# Patient Record
Sex: Female | Born: 1940 | Race: White | Hispanic: No | Marital: Married | Smoking: Never smoker
Health system: Southern US, Community
[De-identification: ages and names within clinical notes are randomized; demographics above are authoritative.]

## PROBLEM LIST (undated history)

## (undated) DIAGNOSIS — E785 Hyperlipidemia, unspecified: Secondary | ICD-10-CM

## (undated) DIAGNOSIS — I10 Essential (primary) hypertension: Secondary | ICD-10-CM

## (undated) HISTORY — DX: Hyperlipidemia, unspecified: E78.5

## (undated) HISTORY — DX: Essential (primary) hypertension: I10

---

## 1998-08-27 ENCOUNTER — Other Ambulatory Visit: Admission: RE | Admit: 1998-08-27 | Discharge: 1998-08-27 | Payer: Self-pay | Admitting: Gynecology

## 1998-09-09 ENCOUNTER — Ambulatory Visit (HOSPITAL_COMMUNITY): Admission: RE | Admit: 1998-09-09 | Discharge: 1998-09-09 | Payer: Self-pay | Admitting: Gynecology

## 2008-04-29 ENCOUNTER — Encounter: Admission: RE | Admit: 2008-04-29 | Discharge: 2008-04-29 | Payer: Self-pay | Admitting: Family Medicine

## 2008-07-08 ENCOUNTER — Emergency Department (HOSPITAL_COMMUNITY): Admission: EM | Admit: 2008-07-08 | Discharge: 2008-07-08 | Payer: Self-pay | Admitting: Emergency Medicine

## 2009-02-09 ENCOUNTER — Emergency Department (HOSPITAL_COMMUNITY): Admission: EM | Admit: 2009-02-09 | Discharge: 2009-02-09 | Payer: Self-pay | Admitting: Emergency Medicine

## 2009-02-10 ENCOUNTER — Emergency Department (HOSPITAL_COMMUNITY): Admission: EM | Admit: 2009-02-10 | Discharge: 2009-02-10 | Payer: Self-pay | Admitting: Emergency Medicine

## 2011-01-12 LAB — DIFFERENTIAL
Basophils Relative: 0 % (ref 0–1)
Eosinophils Absolute: 0 10*3/uL (ref 0.0–0.7)
Eosinophils Relative: 0 % (ref 0–5)
Lymphs Abs: 0.9 10*3/uL (ref 0.7–4.0)
Monocytes Relative: 6 % (ref 3–12)

## 2011-01-12 LAB — CBC
Hemoglobin: 14.5 g/dL (ref 12.0–15.0)
Hemoglobin: 14.9 g/dL (ref 12.0–15.0)
MCHC: 34.5 g/dL (ref 30.0–36.0)
MCHC: 34.6 g/dL (ref 30.0–36.0)
Platelets: 214 10*3/uL (ref 150–400)
RBC: 4.64 MIL/uL (ref 3.87–5.11)
RDW: 13.1 % (ref 11.5–15.5)
WBC: 7.4 10*3/uL (ref 4.0–10.5)

## 2011-01-12 LAB — SAMPLE TO BLOOD BANK

## 2011-01-12 LAB — COMPREHENSIVE METABOLIC PANEL
ALT: 20 U/L (ref 0–35)
AST: 27 U/L (ref 0–37)
Alkaline Phosphatase: 68 U/L (ref 39–117)
CO2: 27 mEq/L (ref 19–32)
Calcium: 9.4 mg/dL (ref 8.4–10.5)
GFR calc Af Amer: >60 mL/min (ref >60)
GFR calc non Af Amer: >60 mL/min (ref >60)
Glucose, Bld: 159 mg/dL — ABNORMAL HIGH (ref 70–99)
Potassium: 3.8 mEq/L (ref 3.5–5.1)
Sodium: 138 mEq/L (ref 135–145)

## 2011-01-12 LAB — PROTIME-INR: Prothrombin Time: 12.7 seconds (ref 11.6–15.2)

## 2012-08-08 DIAGNOSIS — E559 Vitamin D deficiency, unspecified: Secondary | ICD-10-CM | POA: Diagnosis not present

## 2012-08-08 DIAGNOSIS — I10 Essential (primary) hypertension: Secondary | ICD-10-CM | POA: Diagnosis not present

## 2012-08-08 DIAGNOSIS — E782 Mixed hyperlipidemia: Secondary | ICD-10-CM | POA: Diagnosis not present

## 2012-08-22 ENCOUNTER — Other Ambulatory Visit: Payer: Self-pay | Admitting: Family Medicine

## 2012-08-22 DIAGNOSIS — Z0000 Encounter for general adult medical examination without abnormal findings: Secondary | ICD-10-CM | POA: Diagnosis not present

## 2012-08-22 DIAGNOSIS — Z124 Encounter for screening for malignant neoplasm of cervix: Secondary | ICD-10-CM | POA: Diagnosis not present

## 2012-08-22 DIAGNOSIS — Z139 Encounter for screening, unspecified: Secondary | ICD-10-CM

## 2012-08-22 DIAGNOSIS — Z23 Encounter for immunization: Secondary | ICD-10-CM | POA: Diagnosis not present

## 2012-10-06 ENCOUNTER — Ambulatory Visit (HOSPITAL_COMMUNITY)
Admission: RE | Admit: 2012-10-06 | Discharge: 2012-10-06 | Disposition: A | Discharge status: Home / Self Care | Payer: Medicare Other | Source: Ambulatory Visit | Attending: Family Medicine | Admitting: Family Medicine

## 2012-10-06 DIAGNOSIS — Z139 Encounter for screening, unspecified: Secondary | ICD-10-CM

## 2012-10-06 DIAGNOSIS — Z1231 Encounter for screening mammogram for malignant neoplasm of breast: Secondary | ICD-10-CM | POA: Insufficient documentation

## 2013-08-21 ENCOUNTER — Other Ambulatory Visit: Payer: Medicare Other

## 2013-08-21 DIAGNOSIS — Z79899 Other long term (current) drug therapy: Secondary | ICD-10-CM | POA: Diagnosis not present

## 2013-08-21 DIAGNOSIS — I10 Essential (primary) hypertension: Secondary | ICD-10-CM

## 2013-08-21 DIAGNOSIS — E785 Hyperlipidemia, unspecified: Secondary | ICD-10-CM

## 2013-08-21 LAB — CBC WITH DIFFERENTIAL/PLATELET
HCT: 45.2 % (ref 36.0–46.0)
Hemoglobin: 15.5 g/dL — ABNORMAL HIGH (ref 12.0–15.0)
Lymphocytes Relative: 29 % (ref 12–46)
Lymphs Abs: 2 10*3/uL (ref 0.7–4.0)
MCV: 91.3 fL (ref 78.0–100.0)
Monocytes Absolute: 0.6 10*3/uL (ref 0.1–1.0)
Monocytes Relative: 8 % (ref 3–12)
Neutro Abs: 4.3 10*3/uL (ref 1.7–7.7)
WBC: 6.9 10*3/uL (ref 4.0–10.5)

## 2013-08-21 LAB — COMPREHENSIVE METABOLIC PANEL
AST: 25 U/L (ref 0–37)
Albumin: 4.3 g/dL (ref 3.5–5.2)
BUN: 13 mg/dL (ref 6–23)
CO2: 30 mEq/L (ref 19–32)
Calcium: 9.8 mg/dL (ref 8.4–10.5)
Chloride: 100 mEq/L (ref 96–112)
Glucose, Bld: 94 mg/dL (ref 70–99)
Potassium: 4.4 mEq/L (ref 3.5–5.3)

## 2013-08-21 LAB — LIPID PANEL
Cholesterol: 241 mg/dL — ABNORMAL HIGH (ref 0–200)
HDL: 58 mg/dL (ref >39)
Triglycerides: 83 mg/dL (ref <150)

## 2013-08-28 ENCOUNTER — Encounter: Payer: Self-pay | Admitting: Family Medicine

## 2013-08-28 ENCOUNTER — Ambulatory Visit (INDEPENDENT_AMBULATORY_CARE_PROVIDER_SITE_OTHER): Payer: Medicare Other | Admitting: Family Medicine

## 2013-08-28 VITALS — BP 126/88 | HR 92 | Temp 97.0°F | Resp 16 | Wt 178.0 lb

## 2013-08-28 DIAGNOSIS — E785 Hyperlipidemia, unspecified: Secondary | ICD-10-CM

## 2013-08-28 DIAGNOSIS — I10 Essential (primary) hypertension: Secondary | ICD-10-CM

## 2013-08-28 MED ORDER — AMLODIPINE BESY-BENAZEPRIL HCL 5-10 MG PO CAPS: 1.0000 | ORAL_CAPSULE | Freq: Every day | ORAL | Status: DC

## 2013-08-28 MED ORDER — HYDROCHLOROTHIAZIDE 12.5 MG PO CAPS: 12.5000 mg | ORAL_CAPSULE | Freq: Every day | ORAL | Status: DC

## 2013-08-28 MED ORDER — PRAVASTATIN SODIUM 20 MG PO TABS: 20.0000 mg | ORAL_TABLET | Freq: Every day | ORAL | Status: DC

## 2013-08-28 NOTE — Progress Notes (Signed)
Subjective:    Patient ID: Katherine Bass, female    DOB: [birth year], 72 y.o.   MRN: [id number]  HPI  Patient is a 72 year old white female who is here for followup today for her hypertension. She is currently on Lotrel 5/10 one by mouth daily and hydrochlorothiazide 12.5 mg by mouth daily. Her most recent labwork as listed below. She denies any chest pain shortness of breath or dyspnea on exertion. She denies any side effects of medication. Lab on 08/21/2013  Component Date Value Range Status  . WBC 08/21/2013 6.9  4.0 - 10.5 K/uL Final  . RBC 08/21/2013 4.95  3.87 - 5.11 MIL/uL Final  . Hemoglobin 08/21/2013 15.5* 12.0 - 15.0 g/dL Final  . HCT 08/65/7846 45.2  36.0 - 46.0 % Final  . MCV 08/21/2013 91.3  78.0 - 100.0 fL Final  . MCH 08/21/2013 31.3  26.0 - 34.0 pg Final  . MCHC 08/21/2013 34.3  30.0 - 36.0 g/dL Final  . RDW 96/29/5284 13.5  11.5 - 15.5 % Final  . Platelets 08/21/2013 263  150 - 400 K/uL Final  . Neutrophils Relative % 08/21/2013 61  43 - 77 % Final  . Neutro Abs 08/21/2013 4.3  1.7 - 7.7 K/uL Final  . Lymphocytes Relative 08/21/2013 29  12 - 46 % Final  . Lymphs Abs 08/21/2013 2.0  0.7 - 4.0 K/uL Final  . Monocytes Relative 08/21/2013 8  3 - 12 % Final  . Monocytes Absolute 08/21/2013 0.6  0.1 - 1.0 K/uL Final  . Eosinophils Relative 08/21/2013 1  0 - 5 % Final  . Eosinophils Absolute 08/21/2013 0.1  0.0 - 0.7 K/uL Final  . Basophils Relative 08/21/2013 1  0 - 1 % Final  . Basophils Absolute 08/21/2013 0.1  0.0 - 0.1 K/uL Final  . Smear Review 08/21/2013 Criteria for review not met   Final  . Sodium 08/21/2013 138  135 - 145 mEq/L Final  . Potassium 08/21/2013 4.4  3.5 - 5.3 mEq/L Final  . Chloride 08/21/2013 100  96 - 112 mEq/L Final  . CO2 08/21/2013 30  19 - 32 mEq/L Final  . Glucose, Bld 08/21/2013 94  70 - 99 mg/dL Final  . BUN 13/24/4010 13  6 - 23 mg/dL Final  . Creat 27/25/3664 0.66  0.50 - 1.10 mg/dL Final  . Total Bilirubin 08/21/2013 0.7  0.3 - 1.2 mg/dL  Final  . Alkaline Phosphatase 08/21/2013 65  39 - 117 U/L Final  . AST 08/21/2013 25  0 - 37 U/L Final  . ALT 08/21/2013 20  0 - 35 U/L Final  . Total Protein 08/21/2013 7.3  6.0 - 8.3 g/dL Final  . Albumin 40/34/7425 4.3  3.5 - 5.2 g/dL Final  . Calcium 95/63/8756 9.8  8.4 - 10.5 mg/dL Final  . Cholesterol 43/32/9518 241* 0 - 200 mg/dL Final   Comment: ATP III Classification:                                < 200        mg/dL        Desirable                               200 - 239     mg/dL        Borderline High                               >=  240        mg/dL        High                             . Triglycerides 08/21/2013 83  <150 mg/dL Final  . HDL 16/07/9603 58  >39 mg/dL Final  . Total CHOL/HDL Ratio 08/21/2013 4.2   Final  . VLDL 08/21/2013 17  0 - 40 mg/dL Final  . LDL Cholesterol 08/21/2013 166* 0 - 99 mg/dL Final   Comment:                            Total Cholesterol/HDL Ratio:CHD Risk                                                 Coronary Heart Disease Risk Table                                                                 Men       Women                                   1/2 Average Risk              3.4        3.3                                       Average Risk              5.0        4.4                                    2X Average Risk              9.6        7.1                                    3X Average Risk             23.4       11.0                          Use the calculated Patient Ratio above and the CHD Risk table                           to determine the patient's CHD Risk.                          ATP III Classification (LDL):                                <  100        mg/dL         Optimal                               100 - 129     mg/dL         Near or Above Optimal                               130 - 159     mg/dL         Borderline High                               160 - 189     mg/dL         High                                > 190         mg/dL         Very High                              Past Medical History  Diagnosis Date  . Hyperlipidemia   . Hypertension    No current outpatient prescriptions on file prior to visit.   No current facility-administered medications on file prior to visit.   Allergies  Allergen Reactions  . Eggs Or Egg-Derived Products Nausea Only    HA   History   Social History  . Marital Status: Married    Spouse Name: N/A    Number of Children: N/A  . Years of Education: N/A   Occupational History  . Not on file.   Social History Main Topics  . Smoking status: Never Smoker   . Smokeless tobacco: Not on file  . Alcohol Use: No  . Drug Use: No  . Sexual Activity: Not on file   Other Topics Concern  . Not on file   Social History Narrative  . No narrative on file     Review of Systems  All other systems reviewed and are negative.       Objective:   Physical Exam  Vitals reviewed. Constitutional: She is oriented to person, place, and time.  Neck: Neck supple. No JVD present. No thyromegaly present.  Cardiovascular: Normal rate, regular rhythm and normal heart sounds.  Exam reveals no gallop and no friction rub.   No murmur heard. Pulmonary/Chest: Effort normal and breath sounds normal. No respiratory distress. She has no wheezes. She has no rales. She exhibits no tenderness.  Abdominal: Soft. Bowel sounds are normal. She exhibits no distension and no mass. There is no tenderness. There is no rebound and no guarding.  Musculoskeletal: She exhibits no edema.  Lymphadenopathy:    She has no cervical adenopathy.  Neurological: She is alert and oriented to person, place, and time. No cranial nerve deficit. She exhibits normal muscle tone. Coordination normal.          Assessment & Plan:  1. Other and unspecified hyperlipidemia Cholesterol is well above goal of 137 cause of her age and her high blood pressure. The I recommended pravastatin 20 mg by mouth daily  and then  recheck fasting lipid panel in 3 months. Also recommended aspirin 81 mg by mouth daily. Patient will recheck in 3 months.  2. Essential hypertension, benign Blood pressures well controlled. Continue current medications at the present dosages

## 2014-08-27 ENCOUNTER — Other Ambulatory Visit: Payer: Medicare Other

## 2014-08-27 DIAGNOSIS — Z79899 Other long term (current) drug therapy: Secondary | ICD-10-CM

## 2014-08-27 DIAGNOSIS — I10 Essential (primary) hypertension: Secondary | ICD-10-CM

## 2014-08-27 DIAGNOSIS — Z0000 Encounter for general adult medical examination without abnormal findings: Secondary | ICD-10-CM

## 2014-08-27 LAB — COMPLETE METABOLIC PANEL WITH GFR
ALBUMIN: 4.1 g/dL (ref 3.5–5.2)
ALT: 14 U/L (ref 0–35)
AST: 22 U/L (ref 0–37)
Alkaline Phosphatase: 69 U/L (ref 39–117)
BUN: 12 mg/dL (ref 6–23)
CALCIUM: 9.4 mg/dL (ref 8.4–10.5)
CO2: 27 meq/L (ref 19–32)
Chloride: 104 mEq/L (ref 96–112)
Creat: 0.63 mg/dL (ref 0.50–1.10)
GLUCOSE: 102 mg/dL — AB (ref 70–99)
POTASSIUM: 4.2 meq/L (ref 3.5–5.3)
SODIUM: 139 meq/L (ref 135–145)
TOTAL PROTEIN: 6.8 g/dL (ref 6.0–8.3)
Total Bilirubin: 0.5 mg/dL (ref 0.2–1.2)

## 2014-08-27 LAB — CBC WITH DIFFERENTIAL/PLATELET
BASOS ABS: 0.1 10*3/uL (ref 0.0–0.1)
Basophils Relative: 1 % (ref 0–1)
EOS PCT: 2 % (ref 0–5)
Eosinophils Absolute: 0.1 10*3/uL (ref 0.0–0.7)
HEMATOCRIT: 42.2 % (ref 36.0–46.0)
HEMOGLOBIN: 14.3 g/dL (ref 12.0–15.0)
LYMPHS PCT: 35 % (ref 12–46)
Lymphs Abs: 1.9 10*3/uL (ref 0.7–4.0)
MCH: 30.8 pg (ref 26.0–34.0)
MCHC: 33.9 g/dL (ref 30.0–36.0)
MCV: 90.8 fL (ref 78.0–100.0)
MONO ABS: 0.5 10*3/uL (ref 0.1–1.0)
MONOS PCT: 9 % (ref 3–12)
MPV: 10.2 fL (ref 9.4–12.4)
NEUTROS ABS: 2.9 10*3/uL (ref 1.7–7.7)
Neutrophils Relative %: 53 % (ref 43–77)
Platelets: 235 10*3/uL (ref 150–400)
RBC: 4.65 MIL/uL (ref 3.87–5.11)
RDW: 13.5 % (ref 11.5–15.5)
WBC: 5.5 10*3/uL (ref 4.0–10.5)

## 2014-08-27 LAB — LIPID PANEL
Cholesterol: 218 mg/dL — ABNORMAL HIGH (ref 0–200)
HDL: 52 mg/dL (ref >39)
LDL CALC: 144 mg/dL — AB (ref 0–99)
TRIGLYCERIDES: 111 mg/dL (ref <150)
Total CHOL/HDL Ratio: 4.2 Ratio
VLDL: 22 mg/dL (ref 0–40)

## 2014-09-06 ENCOUNTER — Ambulatory Visit (INDEPENDENT_AMBULATORY_CARE_PROVIDER_SITE_OTHER): Payer: Medicare Other | Admitting: Family Medicine

## 2014-09-06 ENCOUNTER — Encounter: Payer: Self-pay | Admitting: Family Medicine

## 2014-09-06 VITALS — BP 166/80 | HR 98 | Temp 98.4°F | Resp 16 | Ht 66.25 in | Wt 181.0 lb

## 2014-09-06 DIAGNOSIS — Z23 Encounter for immunization: Secondary | ICD-10-CM

## 2014-09-06 DIAGNOSIS — Z0000 Encounter for general adult medical examination without abnormal findings: Secondary | ICD-10-CM | POA: Diagnosis not present

## 2014-09-06 DIAGNOSIS — I10 Essential (primary) hypertension: Secondary | ICD-10-CM | POA: Insufficient documentation

## 2014-09-06 DIAGNOSIS — E785 Hyperlipidemia, unspecified: Secondary | ICD-10-CM | POA: Insufficient documentation

## 2014-09-06 MED ORDER — HYDROCHLOROTHIAZIDE 12.5 MG PO CAPS: 12.5000 mg | ORAL_CAPSULE | Freq: Every day | ORAL | Status: DC

## 2014-09-06 MED ORDER — AMLODIPINE BESY-BENAZEPRIL HCL 5-10 MG PO CAPS: 1.0000 | ORAL_CAPSULE | Freq: Every day | ORAL | Status: DC

## 2014-09-06 NOTE — Addendum Note (Signed)
Addended by: Legrand RamsWILLIS, SANDY B on: 09/06/2014 03:37 PM   Modules accepted: Orders

## 2014-09-06 NOTE — Progress Notes (Signed)
Subjective:    Patient ID: Katherine Bass, female    DOB: 1941-08-10, 73 y.o.   MRN: [id number]  HPI  Patient is a 73 year old white female who is here today for complete physical exam. She has no concerns. She is due today for a flu shot and prevnar 13.  She refuses a flu shot but she will concede to Prevnar 13. Her last Pap smear was approximately one year ago per the patient. However she is overdue for a mammogram and a colonoscopy. She refuses both of these. She is very hesitant for any preventative tests.  Her blood pressure is extremely high today. She is very anxious in the doctor's office. Furthermore some things with her today that made her upset. She denies any chest pain shortness of breath or dyspnea on exertion. Past Medical History  Diagnosis Date  . Hyperlipidemia   . Hypertension    No past surgical history on file. No current outpatient prescriptions on file prior to visit.   No current facility-administered medications on file prior to visit.   Allergies  Allergen Reactions  . Eggs Or Egg-Derived Products Nausea Only    HA   History   Social History  . Marital Status: Married    Spouse Name: N/A    Number of Children: N/A  . Years of Education: N/A   Occupational History  . Not on file.   Social History Main Topics  . Smoking status: Never Smoker   . Smokeless tobacco: Not on file  . Alcohol Use: No  . Drug Use: No  . Sexual Activity: Not on file   Other Topics Concern  . Not on file   Social History Narrative   No family history on file.   Review of Systems  All other systems reviewed and are negative.      Objective:   Physical Exam  Constitutional: She is oriented to person, place, and time. She appears well-developed and well-nourished. No distress.  HENT:  Head: Normocephalic and atraumatic.  Right Ear: External ear normal.  Left Ear: External ear normal.  Nose: Nose normal.  Mouth/Throat: Oropharynx is clear and moist. No  oropharyngeal exudate.  Eyes: Conjunctivae and EOM are normal. Pupils are equal, round, and reactive to light. Right eye exhibits no discharge. Left eye exhibits no discharge. No scleral icterus.  Neck: Normal range of motion. Neck supple. No JVD present. No tracheal deviation present. No thyromegaly present.  Cardiovascular: Normal rate, regular rhythm, normal heart sounds and intact distal pulses.  Exam reveals no gallop and no friction rub.   No murmur heard. Pulmonary/Chest: Effort normal and breath sounds normal. No stridor. No respiratory distress. She has no wheezes. She has no rales. She exhibits no tenderness.  Abdominal: Soft. Bowel sounds are normal. She exhibits no distension and no mass. There is no tenderness. There is no rebound and no guarding.  Musculoskeletal: Normal range of motion. She exhibits no edema or tenderness.  Lymphadenopathy:    She has no cervical adenopathy.  Neurological: She is alert and oriented to person, place, and time. She has normal reflexes. She displays normal reflexes. No cranial nerve deficit. She exhibits normal muscle tone. Coordination normal.  Skin: Skin is warm. No rash noted. She is not diaphoretic. No erythema. No pallor.  Psychiatric: She has a normal mood and affect. Her behavior is normal. Judgment and thought content normal.  Vitals reviewed.  patient's breast exam today was normal. There were no palpable nodules in her breast or  in her axilla. On examination today she has a regularly irregular heart rate. Every fifth or sixth heartbeat is a PVC and a pulse.  Lab on 08/27/2014  Component Date Value Ref Range Status  . Cholesterol 08/27/2014 218* 0 - 200 mg/dL Final   Comment: ATP III Classification:       < 200        mg/dL        Desirable      200 - 239     mg/dL        Borderline High      >= 240        mg/dL        High     . Triglycerides 08/27/2014 111  <150 mg/dL Final  . HDL 08/27/2014 52  >39 mg/dL Final  . Total CHOL/HDL  Ratio 08/27/2014 4.2   Final  . VLDL 08/27/2014 22  0 - 40 mg/dL Final  . LDL Cholesterol 08/27/2014 144* 0 - 99 mg/dL Final   Comment:   Total Cholesterol/HDL Ratio:CHD Risk                        Coronary Heart Disease Risk Table                                        Men       Women          1/2 Average Risk              3.4        3.3              Average Risk              5.0        4.4           2X Average Risk              9.6        7.1           3X Average Risk             23.4       11.0 Use the calculated Patient Ratio above and the CHD Risk table  to determine the patient's CHD Risk. ATP III Classification (LDL):       < 100        mg/dL         Optimal      100 - 129     mg/dL         Near or Above Optimal      130 - 159     mg/dL         Borderline High      160 - 189     mg/dL         High       > 190        mg/dL         Very High     . WBC 08/27/2014 5.5  4.0 - 10.5 K/uL Final  . RBC 08/27/2014 4.65  3.87 - 5.11 MIL/uL Final  . Hemoglobin 08/27/2014 14.3  12.0 - 15.0 g/dL Final  . HCT 08/27/2014 42.2  36.0 - 46.0 % Final  . MCV 08/27/2014 90.8  78.0 - 100.0 fL Final  .  MCH 08/27/2014 30.8  26.0 - 34.0 pg Final  . MCHC 08/27/2014 33.9  30.0 - 36.0 g/dL Final  . RDW 08/27/2014 13.5  11.5 - 15.5 % Final  . Platelets 08/27/2014 235  150 - 400 K/uL Final  . Neutrophils Relative % 08/27/2014 53  43 - 77 % Final  . Neutro Abs 08/27/2014 2.9  1.7 - 7.7 K/uL Final  . Lymphocytes Relative 08/27/2014 35  12 - 46 % Final  . Lymphs Abs 08/27/2014 1.9  0.7 - 4.0 K/uL Final  . Monocytes Relative 08/27/2014 9  3 - 12 % Final  . Monocytes Absolute 08/27/2014 0.5  0.1 - 1.0 K/uL Final  . Eosinophils Relative 08/27/2014 2  0 - 5 % Final  . Eosinophils Absolute 08/27/2014 0.1  0.0 - 0.7 K/uL Final  . Basophils Relative 08/27/2014 1  0 - 1 % Final  . Basophils Absolute 08/27/2014 0.1  0.0 - 0.1 K/uL Final  . Smear Review 08/27/2014 Criteria for review not met   Final  . MPV  08/27/2014 10.2  9.4 - 12.4 fL Final  . Sodium 08/27/2014 139  135 - 145 mEq/L Final  . Potassium 08/27/2014 4.2  3.5 - 5.3 mEq/L Final  . Chloride 08/27/2014 104  96 - 112 mEq/L Final  . CO2 08/27/2014 27  19 - 32 mEq/L Final  . Glucose, Bld 08/27/2014 102* 70 - 99 mg/dL Final  . BUN 08/27/2014 12  6 - 23 mg/dL Final  . Creat 08/27/2014 0.63  0.50 - 1.10 mg/dL Final  . Total Bilirubin 08/27/2014 0.5  0.2 - 1.2 mg/dL Final  . Alkaline Phosphatase 08/27/2014 69  39 - 117 U/L Final  . AST 08/27/2014 22  0 - 37 U/L Final  . ALT 08/27/2014 14  0 - 35 U/L Final  . Total Protein 08/27/2014 6.8  6.0 - 8.3 g/dL Final  . Albumin 08/27/2014 4.1  3.5 - 5.2 g/dL Final  . Calcium 08/27/2014 9.4  8.4 - 10.5 mg/dL Final  . GFR, Est African American 08/27/2014 >89   Final  . GFR, Est Non African American 08/27/2014 >89   Final   Comment:   The estimated GFR is a calculation valid for adults (>=57 years old) that uses the CKD-EPI algorithm to adjust for age and sex. It is   not to be used for children, pregnant women, hospitalized patients,    patients on dialysis, or with rapidly changing kidney function. According to the NKDEP, eGFR >89 is normal, 60-89 shows mild impairment, 30-59 shows moderate impairment, 15-29 shows severe impairment and <15 is ESRD.            Assessment & Plan:  Routine general medical examination at a health care facility  Asia's blood pressure is extremely high today. I have asked her to check her blood pressure several times over the next few days and call me with the values Monday or Tuesday. If persistently elevated, I would increase Lotrel to 10/40. Patient received Prevnar 13. She refuses a flu shot, a colonoscopy, and a mammogram at the present time. Her cholesterol is slightly elevated. I recommended fish oil 2 g by mouth daily and recheck in 6 months.

## 2015-09-15 ENCOUNTER — Other Ambulatory Visit: Payer: Medicare Other

## 2015-09-15 DIAGNOSIS — E785 Hyperlipidemia, unspecified: Secondary | ICD-10-CM | POA: Diagnosis not present

## 2015-09-15 DIAGNOSIS — Z79899 Other long term (current) drug therapy: Secondary | ICD-10-CM

## 2015-09-15 DIAGNOSIS — I10 Essential (primary) hypertension: Secondary | ICD-10-CM | POA: Diagnosis not present

## 2015-09-15 DIAGNOSIS — Z0000 Encounter for general adult medical examination without abnormal findings: Secondary | ICD-10-CM | POA: Diagnosis not present

## 2015-09-15 LAB — CBC WITH DIFFERENTIAL/PLATELET
BASOS PCT: 1 % (ref 0–1)
Basophils Absolute: 0.1 10*3/uL (ref 0.0–0.1)
EOS PCT: 1 % (ref 0–5)
Eosinophils Absolute: 0.1 10*3/uL (ref 0.0–0.7)
HEMATOCRIT: 45.4 % (ref 36.0–46.0)
Hemoglobin: 15 g/dL (ref 12.0–15.0)
LYMPHS PCT: 25 % (ref 12–46)
Lymphs Abs: 2 10*3/uL (ref 0.7–4.0)
MCH: 30.9 pg (ref 26.0–34.0)
MCHC: 33 g/dL (ref 30.0–36.0)
MCV: 93.6 fL (ref 78.0–100.0)
MONO ABS: 0.5 10*3/uL (ref 0.1–1.0)
MONOS PCT: 6 % (ref 3–12)
MPV: 10.5 fL (ref 8.6–12.4)
Neutro Abs: 5.4 10*3/uL (ref 1.7–7.7)
Neutrophils Relative %: 67 % (ref 43–77)
Platelets: 271 10*3/uL (ref 150–400)
RBC: 4.85 MIL/uL (ref 3.87–5.11)
RDW: 13.3 % (ref 11.5–15.5)
WBC: 8 10*3/uL (ref 4.0–10.5)

## 2015-09-15 LAB — LIPID PANEL
CHOLESTEROL: 228 mg/dL — AB (ref 125–200)
HDL: 63 mg/dL (ref >=46)
LDL CALC: 146 mg/dL — AB (ref <130)
TRIGLYCERIDES: 94 mg/dL (ref <150)
Total CHOL/HDL Ratio: 3.6 Ratio (ref <=5.0)
VLDL: 19 mg/dL (ref <30)

## 2015-09-15 LAB — COMPLETE METABOLIC PANEL WITH GFR
ALT: 17 U/L (ref 6–29)
AST: 24 U/L (ref 10–35)
Albumin: 3.9 g/dL (ref 3.6–5.1)
Alkaline Phosphatase: 66 U/L (ref 33–130)
BILIRUBIN TOTAL: 0.5 mg/dL (ref 0.2–1.2)
BUN: 12 mg/dL (ref 7–25)
CO2: 27 mmol/L (ref 20–31)
Calcium: 9.4 mg/dL (ref 8.6–10.4)
Chloride: 104 mmol/L (ref 98–110)
Creat: 0.56 mg/dL — ABNORMAL LOW (ref 0.60–0.93)
Glucose, Bld: 102 mg/dL — ABNORMAL HIGH (ref 70–99)
Potassium: 4.4 mmol/L (ref 3.5–5.3)
Sodium: 140 mmol/L (ref 135–146)
TOTAL PROTEIN: 6.8 g/dL (ref 6.1–8.1)

## 2015-09-16 LAB — TSH: TSH: 1.944 u[IU]/mL (ref 0.350–4.500)

## 2015-09-30 ENCOUNTER — Ambulatory Visit (INDEPENDENT_AMBULATORY_CARE_PROVIDER_SITE_OTHER): Payer: Medicare Other | Admitting: Family Medicine

## 2015-09-30 ENCOUNTER — Encounter: Payer: Self-pay | Admitting: Family Medicine

## 2015-09-30 VITALS — BP 104/70 | HR 80 | Temp 98.3°F | Resp 16 | Ht 66.25 in | Wt 180.0 lb

## 2015-09-30 DIAGNOSIS — Z13820 Encounter for screening for osteoporosis: Secondary | ICD-10-CM

## 2015-09-30 DIAGNOSIS — Z1231 Encounter for screening mammogram for malignant neoplasm of breast: Secondary | ICD-10-CM | POA: Diagnosis not present

## 2015-09-30 DIAGNOSIS — Z1239 Encounter for other screening for malignant neoplasm of breast: Secondary | ICD-10-CM

## 2015-09-30 DIAGNOSIS — L821 Other seborrheic keratosis: Secondary | ICD-10-CM | POA: Diagnosis not present

## 2015-09-30 DIAGNOSIS — Z0000 Encounter for general adult medical examination without abnormal findings: Secondary | ICD-10-CM

## 2015-09-30 MED ORDER — HYDROCHLOROTHIAZIDE 12.5 MG PO CAPS: 12.5000 mg | ORAL_CAPSULE | Freq: Every day | ORAL | Status: DC

## 2015-09-30 MED ORDER — AMLODIPINE BESY-BENAZEPRIL HCL 5-10 MG PO CAPS: 1.0000 | ORAL_CAPSULE | Freq: Every day | ORAL | Status: DC

## 2015-09-30 NOTE — Progress Notes (Signed)
Subjective:    Patient ID: Katherine Bass, female    DOB: 1940-10-10, 74 y.o.   MRN: [id number]  HPI   Patient is a 74 year old white female who is here today for complete physical exam. She has no concerns. She is due today for a flu shot and zostavax.  She refuses a flu shot and zostavax. Marland Kitchen However she is overdue for a mammogram and a colonoscopy. She refuses both of these. She is very hesitant for any preventative tests. Last year I recommended at least performing fecal occult blood cards which the patient forgot to bring back. She still refuses the colonoscopy but she will consent to fecal occult blood cards. After further talking she will also consent to a mammogram as well as a bone density test. However on ascitic a tremendous amount of convincing on my part to have her agree to these test. Due to age, she does not require a Pap smear Past Medical History  Diagnosis Date  . Hyperlipidemia   . Hypertension    No past surgical history on file. No current outpatient prescriptions on file prior to visit.   No current facility-administered medications on file prior to visit.   Allergies  Allergen Reactions  . Eggs Or Egg-Derived Products Nausea Only    HA   Social History   Social History  . Marital Status: Married    Spouse Name: N/A  . Number of Children: N/A  . Years of Education: N/A   Occupational History  . Not on file.   Social History Main Topics  . Smoking status: Never Smoker   . Smokeless tobacco: Not on file  . Alcohol Use: No  . Drug Use: No  . Sexual Activity: Not on file   Other Topics Concern  . Not on file   Social History Narrative   No family history on file.   Review of Systems  All other systems reviewed and are negative.      Objective:   Physical Exam  Constitutional: She is oriented to person, place, and time. She appears well-developed and well-nourished. No distress.  HENT:  Head: Normocephalic and atraumatic.  Right Ear: External  ear normal.  Left Ear: External ear normal.  Nose: Nose normal.  Mouth/Throat: Oropharynx is clear and moist. No oropharyngeal exudate.  Eyes: Conjunctivae and EOM are normal. Pupils are equal, round, and reactive to light. Right eye exhibits no discharge. Left eye exhibits no discharge. No scleral icterus.  Neck: Normal range of motion. Neck supple. No JVD present. No tracheal deviation present. No thyromegaly present.  Cardiovascular: Normal rate, regular rhythm, normal heart sounds and intact distal pulses.  Exam reveals no gallop and no friction rub.   No murmur heard. Pulmonary/Chest: Effort normal and breath sounds normal. No stridor. No respiratory distress. She has no wheezes. She has no rales. She exhibits no tenderness.  Abdominal: Soft. Bowel sounds are normal. She exhibits no distension and no mass. There is no tenderness. There is no rebound and no guarding.  Musculoskeletal: Normal range of motion. She exhibits no edema or tenderness.  Lymphadenopathy:    She has no cervical adenopathy.  Neurological: She is alert and oriented to person, place, and time. She has normal reflexes. No cranial nerve deficit. She exhibits normal muscle tone. Coordination normal.  Skin: Skin is warm. No rash noted. She is not diaphoretic. No erythema. No pallor.  Psychiatric: She has a normal mood and affect. Her behavior is normal. Judgment and thought content normal.  Vitals reviewed. On examination today she has a regularly irregular heart rate. Every fifth or sixth heartbeat is a PVC and a pulse.  Lab on 09/15/2015  Component Date Value Ref Range Status  . Sodium 09/15/2015 140  135 - 146 mmol/L Final  . Potassium 09/15/2015 4.4  3.5 - 5.3 mmol/L Final  . Chloride 09/15/2015 104  98 - 110 mmol/L Final  . CO2 09/15/2015 27  20 - 31 mmol/L Final  . Glucose, Bld 09/15/2015 102* 70 - 99 mg/dL Final  . BUN 09/15/2015 12  7 - 25 mg/dL Final  . Creat 09/15/2015 0.56* 0.60 - 0.93 mg/dL Final  . Total  Bilirubin 09/15/2015 0.5  0.2 - 1.2 mg/dL Final  . Alkaline Phosphatase 09/15/2015 66  33 - 130 U/L Final  . AST 09/15/2015 24  10 - 35 U/L Final  . ALT 09/15/2015 17  6 - 29 U/L Final  . Total Protein 09/15/2015 6.8  6.1 - 8.1 g/dL Final  . Albumin 09/15/2015 3.9  3.6 - 5.1 g/dL Final  . Calcium 09/15/2015 9.4  8.6 - 10.4 mg/dL Final  . GFR, Est African American 09/15/2015 >89  >=60 mL/min Final  . GFR, Est Non African American 09/15/2015 >89  >=60 mL/min Final   Comment:   The estimated GFR is a calculation valid for adults (>=103 years old) that uses the CKD-EPI algorithm to adjust for age and sex. It is   not to be used for children, pregnant women, hospitalized patients,    patients on dialysis, or with rapidly changing kidney function. According to the NKDEP, eGFR >89 is normal, 60-89 shows mild impairment, 30-59 shows moderate impairment, 15-29 shows severe impairment and <15 is ESRD.     Marland Kitchen TSH 09/15/2015 1.944  0.350 - 4.500 uIU/mL Final  . Cholesterol 09/15/2015 228* 125 - 200 mg/dL Final  . Triglycerides 09/15/2015 94  <150 mg/dL Final  . HDL 09/15/2015 63  >=46 mg/dL Final  . Total CHOL/HDL Ratio 09/15/2015 3.6  <=5.0 Ratio Final  . VLDL 09/15/2015 19  <30 mg/dL Final  . LDL Cholesterol 09/15/2015 146* <130 mg/dL Final   Comment:   Total Cholesterol/HDL Ratio:CHD Risk                        Coronary Heart Disease Risk Table                                        Men       Women          1/2 Average Risk              3.4        3.3              Average Risk              5.0        4.4           2X Average Risk              9.6        7.1           3X Average Risk             23.4       11.0 Use the calculated Patient Ratio above and the CHD Risk table  to determine the patient's CHD Risk.   . WBC 09/15/2015 8.0  4.0 - 10.5 K/uL Final  . RBC 09/15/2015 4.85  3.87 - 5.11 MIL/uL Final  . Hemoglobin 09/15/2015 15.0  12.0 - 15.0 g/dL Final  . HCT 09/15/2015 45.4  36.0 -  46.0 % Final  . MCV 09/15/2015 93.6  78.0 - 100.0 fL Final  . MCH 09/15/2015 30.9  26.0 - 34.0 pg Final  . MCHC 09/15/2015 33.0  30.0 - 36.0 g/dL Final  . RDW 09/15/2015 13.3  11.5 - 15.5 % Final  . Platelets 09/15/2015 271  150 - 400 K/uL Final  . MPV 09/15/2015 10.5  8.6 - 12.4 fL Final  . Neutrophils Relative % 09/15/2015 67  43 - 77 % Final  . Neutro Abs 09/15/2015 5.4  1.7 - 7.7 K/uL Final  . Lymphocytes Relative 09/15/2015 25  12 - 46 % Final  . Lymphs Abs 09/15/2015 2.0  0.7 - 4.0 K/uL Final  . Monocytes Relative 09/15/2015 6  3 - 12 % Final  . Monocytes Absolute 09/15/2015 0.5  0.1 - 1.0 K/uL Final  . Eosinophils Relative 09/15/2015 1  0 - 5 % Final  . Eosinophils Absolute 09/15/2015 0.1  0.0 - 0.7 K/uL Final  . Basophils Relative 09/15/2015 1  0 - 1 % Final  . Basophils Absolute 09/15/2015 0.1  0.0 - 0.1 K/uL Final  . Smear Review 09/15/2015 Criteria for review not met   Final         Assessment & Plan:  Routine general medical examination at a health care facility  Seborrheic keratoses - Plan: Ambulatory referral to Dermatology  Breast cancer screening, high risk patient - Plan: MM Digital Screening  Screening for osteoporosis - Plan: DG Bone Density  Patient refuses any vaccinations. Blood pressure is excellent. Lab work is acceptable. I will schedule her for a mammogram. I will also schedule her for a bone density. I asked the patient to bring back the stool cards which she received from our lab at her last encounter. She states that she will do so. She refuses a colonoscopy. She does have 3 small seborrheic keratoses. 2 on the right cheek and one is on the left cheek. She requests to see a dermatologist to have these removed for cosmetic reasons.

## 2015-10-21 ENCOUNTER — Telehealth: Payer: Self-pay | Admitting: *Deleted

## 2015-10-21 ENCOUNTER — Other Ambulatory Visit: Payer: Self-pay | Admitting: Family Medicine

## 2015-10-21 DIAGNOSIS — E2839 Other primary ovarian failure: Secondary | ICD-10-CM

## 2015-10-21 DIAGNOSIS — Z1231 Encounter for screening mammogram for malignant neoplasm of breast: Secondary | ICD-10-CM

## 2015-10-21 NOTE — Telephone Encounter (Signed)
Pt has appointment for Bone Density and Mammogram scheduled at [facility] [place] on Feb 10, 17 pt is to arrive at 1:40pm for 2:00pm Bone Density and 2:30pm Mammogram, if pt is taking any calcuim supplements needs to stop taking 48 hrs prior to her appt. [facility] for appt information

## 2015-10-21 NOTE — Telephone Encounter (Signed)
Pt called back and aware of appt 

## 2015-10-30 DIAGNOSIS — L570 Actinic keratosis: Secondary | ICD-10-CM | POA: Diagnosis not present

## 2015-10-30 DIAGNOSIS — L821 Other seborrheic keratosis: Secondary | ICD-10-CM | POA: Diagnosis not present

## 2015-11-14 ENCOUNTER — Ambulatory Visit
Admission: RE | Admit: 2015-11-14 | Discharge: 2015-11-14 | Disposition: A | Discharge status: Home / Self Care | Payer: Medicare Other | Source: Ambulatory Visit | Attending: Family Medicine | Admitting: Family Medicine

## 2015-11-14 DIAGNOSIS — Z1231 Encounter for screening mammogram for malignant neoplasm of breast: Secondary | ICD-10-CM

## 2015-11-14 DIAGNOSIS — E2839 Other primary ovarian failure: Secondary | ICD-10-CM

## 2015-11-14 DIAGNOSIS — M85852 Other specified disorders of bone density and structure, left thigh: Secondary | ICD-10-CM | POA: Diagnosis not present

## 2015-11-26 ENCOUNTER — Encounter: Payer: Self-pay | Admitting: Family Medicine

## 2016-08-09 ENCOUNTER — Ambulatory Visit (INDEPENDENT_AMBULATORY_CARE_PROVIDER_SITE_OTHER): Payer: Medicare Other | Admitting: Physician Assistant

## 2016-08-09 ENCOUNTER — Encounter: Payer: Self-pay | Admitting: Physician Assistant

## 2016-08-09 VITALS — BP 150/82 | HR 104 | Temp 98.1°F | Resp 16 | Wt 184.0 lb

## 2016-08-09 DIAGNOSIS — L239 Allergic contact dermatitis, unspecified cause: Secondary | ICD-10-CM | POA: Diagnosis not present

## 2016-08-09 DIAGNOSIS — R21 Rash and other nonspecific skin eruption: Secondary | ICD-10-CM

## 2016-08-09 MED ORDER — METHYLPREDNISOLONE ACETATE 40 MG/ML IJ SUSP
60.0000 mg | Freq: Once | INTRAMUSCULAR | Status: AC
  Administered 2016-08-09: 60 mg via INTRAMUSCULAR

## 2016-08-09 MED ORDER — METHYLPREDNISOLONE ACETATE 40 MG/ML IJ SUSP: 40.0000 mg | Freq: Once | INTRAMUSCULAR | Status: DC

## 2016-08-09 NOTE — Progress Notes (Signed)
    Patient ID: Katherine Bass MRN: [id number], DOB: [birth year], 75 y.o. Date of Encounter: 08/09/2016, 11:53 AM    Chief Complaint:  Chief Complaint  Patient presents with  . Rash    poison ivy     HPI: 75 y.o. year old female presents with above. Says that she was trimming some crape myrtles on October 21. Says that she was carrying them down to the brush pile using her right arm and they were rubbing up against her right abdomen. Says that after that she developed this itchy rash on her right wrist and upper right arm and also has a large area on her right abdomen. Also has developed areas of itchy rash on her right upper thigh. Also has developed one area on her left wrist. Has been applying calamine lotion with minimal relief.     Home Meds:   Outpatient Medications Prior to Visit  Medication Sig Dispense Refill  . amLODipine-benazepril (LOTREL) 5-10 MG capsule Take 1 capsule by mouth daily. 90 capsule 4  . hydrochlorothiazide (MICROZIDE) 12.5 MG capsule Take 1 capsule (12.5 mg total) by mouth daily. 90 capsule 4   No facility-administered medications prior to visit.     Allergies:  Allergies  Allergen Reactions  . Eggs Or Egg-Derived Products Nausea Only    HA      Review of Systems: See HPI for pertinent ROS. All other ROS negative.    Physical Exam: Blood pressure (!) 150/82, pulse (!) 104, temperature 98.1 F (36.7 C), temperature source Oral, resp. rate 16, weight 184 lb (83.5 kg), SpO2 98 %., Body mass index is 29.47 kg/m. General:  WNWD WF. Appears in no acute distress. Neck: Supple. No thyromegaly. No lymphadenopathy. Lungs: Clear bilaterally to auscultation without wheezes, rales, or rhonchi. Breathing is unlabored. Heart: Regular rhythm. No murmurs, rubs, or gallops. Msk:  Strength and tone normal for age. Skin: Right Wrist, Forearm: There are erythematous papules some of which are in a linear distribution. Right Abdomen, right Flank--- area of pink  urticarial rash--approx 3 inch diameter Right upper thigh--Pink urticarial rash Left forearm/wrist: A few pink papules Neuro: Alert and oriented X 3. Moves all extremities spontaneously. Gait is normal. CNII-XII grossly in tact. Psych:  Responds to questions appropriately with a normal affect.     ASSESSMENT AND PLAN:  75 y.o. year old female   1. Allergic dermatitis Discussed oral versus injection of prednisone and she is agreeable to do the injection. Discussed possible side effects she may experience. Discussed with her that if the itching and rash do not resolve then follow-up or if it starts to resolve but then reoccurs then follow-up with us as well.  - methylPREDNISolone acetate (DEPO-MEDROL) injection 60 mg; Inject 1.5 mLs (60 mg total) into the muscle once.    Signed, [address]Mary Beth MaskellDixon, GeorgiaPA, [facility] 08/09/2016 11:53 AM

## 2016-10-06 ENCOUNTER — Other Ambulatory Visit: Payer: Self-pay | Admitting: Family Medicine

## 2016-10-06 DIAGNOSIS — Z0000 Encounter for general adult medical examination without abnormal findings: Secondary | ICD-10-CM

## 2016-10-06 DIAGNOSIS — E785 Hyperlipidemia, unspecified: Secondary | ICD-10-CM

## 2016-10-06 DIAGNOSIS — I10 Essential (primary) hypertension: Secondary | ICD-10-CM

## 2016-10-06 DIAGNOSIS — Z79899 Other long term (current) drug therapy: Secondary | ICD-10-CM

## 2016-10-06 DIAGNOSIS — M8580 Other specified disorders of bone density and structure, unspecified site: Secondary | ICD-10-CM | POA: Insufficient documentation

## 2016-10-07 ENCOUNTER — Other Ambulatory Visit: Payer: Self-pay | Admitting: Family Medicine

## 2016-10-07 ENCOUNTER — Other Ambulatory Visit: Payer: Medicare Other

## 2016-10-07 DIAGNOSIS — E785 Hyperlipidemia, unspecified: Secondary | ICD-10-CM | POA: Diagnosis not present

## 2016-10-07 DIAGNOSIS — Z0000 Encounter for general adult medical examination without abnormal findings: Secondary | ICD-10-CM | POA: Diagnosis not present

## 2016-10-07 DIAGNOSIS — M8580 Other specified disorders of bone density and structure, unspecified site: Secondary | ICD-10-CM | POA: Diagnosis not present

## 2016-10-07 DIAGNOSIS — Z79899 Other long term (current) drug therapy: Secondary | ICD-10-CM

## 2016-10-07 DIAGNOSIS — I10 Essential (primary) hypertension: Secondary | ICD-10-CM | POA: Diagnosis not present

## 2016-10-07 DIAGNOSIS — E559 Vitamin D deficiency, unspecified: Secondary | ICD-10-CM | POA: Diagnosis not present

## 2016-10-07 LAB — COMPLETE METABOLIC PANEL WITH GFR
ALBUMIN: 4 g/dL (ref 3.6–5.1)
ALK PHOS: 65 U/L (ref 33–130)
ALT: 17 U/L (ref 6–29)
AST: 24 U/L (ref 10–35)
BUN: 8 mg/dL (ref 7–25)
CALCIUM: 9.6 mg/dL (ref 8.6–10.4)
CHLORIDE: 104 mmol/L (ref 98–110)
CO2: 26 mmol/L (ref 20–31)
Creat: 0.67 mg/dL (ref 0.60–0.93)
GFR, Est Non African American: 87 mL/min (ref >=60)
Glucose, Bld: 109 mg/dL — ABNORMAL HIGH (ref 70–99)
POTASSIUM: 4.5 mmol/L (ref 3.5–5.3)
Sodium: 142 mmol/L (ref 135–146)
Total Bilirubin: 0.6 mg/dL (ref 0.2–1.2)
Total Protein: 6.7 g/dL (ref 6.1–8.1)

## 2016-10-07 LAB — CBC WITH DIFFERENTIAL/PLATELET
BASOS PCT: 1 %
Basophils Absolute: 67 cells/uL (ref 0–200)
Eosinophils Absolute: 67 cells/uL (ref 15–500)
Eosinophils Relative: 1 %
HEMATOCRIT: 45 % (ref 35.0–45.0)
HEMOGLOBIN: 14.4 g/dL (ref 12.0–15.0)
LYMPHS ABS: 1742 {cells}/uL (ref 850–3900)
Lymphocytes Relative: 26 %
MCH: 30.6 pg (ref 27.0–33.0)
MCHC: 32 g/dL (ref 32.0–36.0)
MCV: 95.7 fL (ref 80.0–100.0)
MONO ABS: 469 {cells}/uL (ref 200–950)
MPV: 10.3 fL (ref 7.5–12.5)
Monocytes Relative: 7 %
NEUTROS ABS: 4355 {cells}/uL (ref 1500–7800)
NEUTROS PCT: 65 %
Platelets: 258 10*3/uL (ref 140–400)
RBC: 4.7 MIL/uL (ref 3.80–5.10)
RDW: 14.2 % (ref 11.0–15.0)
WBC: 6.7 10*3/uL (ref 3.8–10.8)

## 2016-10-07 LAB — TSH: TSH: 2.62 mIU/L

## 2016-10-07 LAB — LIPID PANEL
CHOL/HDL RATIO: 3.4 ratio (ref <5.0)
Cholesterol: 219 mg/dL — ABNORMAL HIGH (ref <200)
HDL: 65 mg/dL (ref >50)
LDL CALC: 137 mg/dL — AB (ref <100)
Triglycerides: 86 mg/dL (ref <150)
VLDL: 17 mg/dL (ref <30)

## 2016-10-07 MED ORDER — HYDROCHLOROTHIAZIDE 12.5 MG PO CAPS: 12.5000 mg | ORAL_CAPSULE | Freq: Every day | ORAL | 1 refills | Status: DC

## 2016-10-07 NOTE — Telephone Encounter (Signed)
Medication refilled per protocol. 

## 2016-10-07 NOTE — Telephone Encounter (Signed)
Patient needing refill on her hctz to go to [facility] [place] she is calling pharmacy too

## 2016-10-08 LAB — VITAMIN D 25 HYDROXY (VIT D DEFICIENCY, FRACTURES): Vit D, 25-Hydroxy: 25 ng/mL — ABNORMAL LOW (ref 30–100)

## 2016-10-14 ENCOUNTER — Encounter: Payer: Self-pay | Admitting: Family Medicine

## 2016-10-14 ENCOUNTER — Ambulatory Visit (INDEPENDENT_AMBULATORY_CARE_PROVIDER_SITE_OTHER): Payer: Medicare Other | Admitting: Family Medicine

## 2016-10-14 VITALS — BP 152/86 | HR 98 | Temp 98.1°F | Ht 66.0 in | Wt 174.0 lb

## 2016-10-14 DIAGNOSIS — Z0000 Encounter for general adult medical examination without abnormal findings: Secondary | ICD-10-CM

## 2016-10-14 MED ORDER — AMLODIPINE BESY-BENAZEPRIL HCL 5-10 MG PO CAPS: 1.0000 | ORAL_CAPSULE | Freq: Every day | ORAL | 4 refills | Status: DC

## 2016-10-14 MED ORDER — HYDROCHLOROTHIAZIDE 12.5 MG PO CAPS: 12.5000 mg | ORAL_CAPSULE | Freq: Every day | ORAL | 3 refills | Status: DC

## 2016-10-14 NOTE — Progress Notes (Signed)
Subjective:    Patient ID: Katherine Bass, female    DOB: Feb 05, 1941, 76 y.o.   MRN: [id number]  HPI  Patient is a 76 year old white female who is here today for complete physical exam. She has no concerns. She is due today for a flu shot and zostavax.  She refuses a flu shot and zostavax. . She did get a mammogram last year. She refuses one this year. She continues to refuse a colonoscopy.  She is very hesitant for any preventative tests. She does not require a Pap smear. In density test was performed last year and was normal with a T score of -1.2 in the left hip. Her blood pressure here is slightly high however she is also suffers from white coat syndrome Past Medical History:  Diagnosis Date  . Hyperlipidemia   . Hypertension    No past surgical history on file. Current Outpatient Prescriptions on File Prior to Visit  Medication Sig Dispense Refill  . Cholecalciferol (VITAMIN D) 2000 units CAPS Take 1 capsule by mouth.     No current facility-administered medications on file prior to visit.    Allergies  Allergen Reactions  . Eggs Or Egg-Derived Products Nausea Only    HA   Social History   Social History  . Marital status: Married    Spouse name: N/A  . Number of children: N/A  . Years of education: N/A   Occupational History  . Not on file.   Social History Main Topics  . Smoking status: Never Smoker  . Smokeless tobacco: Not on file  . Alcohol use No  . Drug use: No  . Sexual activity: Not on file   Other Topics Concern  . Not on file   Social History Narrative  . No narrative on file   No family history on file.   Review of Systems  All other systems reviewed and are negative.      Objective:   Physical Exam  Constitutional: She is oriented to person, place, and time. She appears well-developed and well-nourished. No distress.  HENT:  Head: Normocephalic and atraumatic.  Right Ear: External ear normal.  Left Ear: External ear normal.  Nose: Nose  normal.  Mouth/Throat: Oropharynx is clear and moist. No oropharyngeal exudate.  Eyes: Conjunctivae and EOM are normal. Pupils are equal, round, and reactive to light. Right eye exhibits no discharge. Left eye exhibits no discharge. No scleral icterus.  Neck: Normal range of motion. Neck supple. No JVD present. No tracheal deviation present. No thyromegaly present.  Cardiovascular: Normal rate, regular rhythm, normal heart sounds and intact distal pulses.  Exam reveals no gallop and no friction rub.   No murmur heard. Pulmonary/Chest: Effort normal and breath sounds normal. No stridor. No respiratory distress. She has no wheezes. She has no rales. She exhibits no tenderness.  Abdominal: Soft. Bowel sounds are normal. She exhibits no distension and no mass. There is no tenderness. There is no rebound and no guarding.  Musculoskeletal: Normal range of motion. She exhibits no edema or tenderness.  Lymphadenopathy:    She has no cervical adenopathy.  Neurological: She is alert and oriented to person, place, and time. She has normal reflexes. No cranial nerve deficit. She exhibits normal muscle tone. Coordination normal.  Skin: Skin is warm. No rash noted. She is not diaphoretic. No erythema. No pallor.  Psychiatric: She has a normal mood and affect. Her behavior is normal. Judgment and thought content normal.  Vitals reviewed. On examination today  she has a regularly irregular heart rate. Every fifth or sixth heartbeat is a PVC and a pulse.  Appointment on 10/07/2016  Component Date Value Ref Range Status  . Vit D, 25-Hydroxy 10/08/2016 25* 30 - 100 ng/mL Final   Comment: Vitamin D Status           25-OH Vitamin D        Deficiency                <20 ng/mL        Insufficiency         20 - 29 ng/mL        Optimal             > or = 30 ng/mL   For 25-OH Vitamin D testing on patients on D2-supplementation and patients for whom quantitation of D2 and D3 fractions is required, the [facility]  25-OH VIT D, (D2,D3), LC/MS/MS is recommended: order code [id number] (patients > 2 yrs).   . WBC 10/07/2016 6.7  3.8 - 10.8 K/uL Final  . RBC 10/07/2016 4.70  3.80 - 5.10 MIL/uL Final  . Hemoglobin 10/07/2016 14.4  12.0 - 15.0 g/dL Final  . HCT 10/07/2016 45.0  35.0 - 45.0 % Final  . MCV 10/07/2016 95.7  80.0 - 100.0 fL Final  . MCH 10/07/2016 30.6  27.0 - 33.0 pg Final  . MCHC 10/07/2016 32.0  32.0 - 36.0 g/dL Final  . RDW 10/07/2016 14.2  11.0 - 15.0 % Final  . Platelets 10/07/2016 258  140 - 400 K/uL Final  . MPV 10/07/2016 10.3  7.5 - 12.5 fL Final  . Neutro Abs 10/07/2016 4355  1,500 - 7,800 cells/uL Final  . Lymphs Abs 10/07/2016 1742  850 - 3,900 cells/uL Final  . Monocytes Absolute 10/07/2016 469  200 - 950 cells/uL Final  . Eosinophils Absolute 10/07/2016 67  15 - 500 cells/uL Final  . Basophils Absolute 10/07/2016 67  0 - 200 cells/uL Final  . Neutrophils Relative % 10/07/2016 65  % Final  . Lymphocytes Relative 10/07/2016 26  % Final  . Monocytes Relative 10/07/2016 7  % Final  . Eosinophils Relative 10/07/2016 1  % Final  . Basophils Relative 10/07/2016 1  % Final  . Smear Review 10/07/2016 Criteria for review not met   Final  . Cholesterol 10/07/2016 219* <200 mg/dL Final  . Triglycerides 10/07/2016 86  <150 mg/dL Final  . HDL 10/07/2016 65  >50 mg/dL Final  . Total CHOL/HDL Ratio 10/07/2016 3.4  <5.0 Ratio Final  . VLDL 10/07/2016 17  <30 mg/dL Final  . LDL Cholesterol 10/07/2016 137* <100 mg/dL Final  . TSH 10/07/2016 2.62  mIU/L Final   Comment:   Reference Range   > or = 20 Years  0.40-4.50   Pregnancy Range First trimester  0.26-2.66 Second trimester 0.55-2.73 Third trimester  0.43-2.91     . Sodium 10/07/2016 142  135 - 146 mmol/L Final  . Potassium 10/07/2016 4.5  3.5 - 5.3 mmol/L Final  . Chloride 10/07/2016 104  98 - 110 mmol/L Final  . CO2 10/07/2016 26  20 - 31 mmol/L Final  . Glucose, Bld 10/07/2016 109* 70 - 99 mg/dL Final  . BUN 10/07/2016 8  7 -  25 mg/dL Final  . Creat 10/07/2016 0.67  0.60 - 0.93 mg/dL Final   Comment:   For patients > or = 75 years of age: The upper reference limit for Creatinine is approximately 13% higher for  people identified as African-American.     . Total Bilirubin 10/07/2016 0.6  0.2 - 1.2 mg/dL Final  . Alkaline Phosphatase 10/07/2016 65  33 - 130 U/L Final  . AST 10/07/2016 24  10 - 35 U/L Final  . ALT 10/07/2016 17  6 - 29 U/L Final  . Total Protein 10/07/2016 6.7  6.1 - 8.1 g/dL Final  . Albumin 10/07/2016 4.0  3.6 - 5.1 g/dL Final  . Calcium 10/07/2016 9.6  8.6 - 10.4 mg/dL Final  . GFR, Est African American 10/07/2016 >89  >=60 mL/min Final  . GFR, Est Non African American 10/07/2016 87  >=60 mL/min Final         Assessment & Plan:  Routine general medical examination at a health care facility  Patient refuses any vaccinations.  Lab work is acceptable. Refuses a mammogram this year but she will consent 1 next year. I did recommend that she double up on her vitamin D and eat less carbohydrates. Strongly recommended a flu shot and a colonoscopy and she refused them both. Also recommended that she get the shingles vaccine and she will consider it

## 2017-11-02 ENCOUNTER — Other Ambulatory Visit: Payer: Medicare Other

## 2017-11-02 DIAGNOSIS — I10 Essential (primary) hypertension: Secondary | ICD-10-CM

## 2017-11-02 DIAGNOSIS — E785 Hyperlipidemia, unspecified: Secondary | ICD-10-CM | POA: Diagnosis not present

## 2017-11-02 DIAGNOSIS — Z79899 Other long term (current) drug therapy: Secondary | ICD-10-CM

## 2017-11-02 LAB — CBC WITH DIFFERENTIAL/PLATELET
BASOS ABS: 62 {cells}/uL (ref 0–200)
Basophils Relative: 1.2 %
EOS ABS: 31 {cells}/uL (ref 15–500)
EOS PCT: 0.6 %
HEMATOCRIT: 42.1 % (ref 35.0–45.0)
Hemoglobin: 14.4 g/dL (ref 11.7–15.5)
Lymphs Abs: 1576 cells/uL (ref 850–3900)
MCH: 31.2 pg (ref 27.0–33.0)
MCHC: 34.2 g/dL (ref 32.0–36.0)
MCV: 91.3 fL (ref 80.0–100.0)
MONOS PCT: 9.8 %
MPV: 10.6 fL (ref 7.5–12.5)
NEUTROS PCT: 58.1 %
Neutro Abs: 3021 cells/uL (ref 1500–7800)
Platelets: 241 10*3/uL (ref 140–400)
RBC: 4.61 10*6/uL (ref 3.80–5.10)
RDW: 12.6 % (ref 11.0–15.0)
Total Lymphocyte: 30.3 %
WBC mixed population: 510 cells/uL (ref 200–950)
WBC: 5.2 10*3/uL (ref 3.8–10.8)

## 2017-11-02 LAB — COMPREHENSIVE METABOLIC PANEL
AG Ratio: 1.4 (calc) (ref 1.0–2.5)
ALT: 17 U/L (ref 6–29)
AST: 28 U/L (ref 10–35)
Albumin: 3.9 g/dL (ref 3.6–5.1)
Alkaline phosphatase (APISO): 67 U/L (ref 33–130)
BILIRUBIN TOTAL: 0.6 mg/dL (ref 0.2–1.2)
BUN: 12 mg/dL (ref 7–25)
CO2: 28 mmol/L (ref 20–32)
Calcium: 9.5 mg/dL (ref 8.6–10.4)
Chloride: 104 mmol/L (ref 98–110)
Creat: 0.66 mg/dL (ref 0.60–0.93)
GLUCOSE: 109 mg/dL — AB (ref 65–99)
Globulin: 2.7 g/dL (calc) (ref 1.9–3.7)
Potassium: 4.4 mmol/L (ref 3.5–5.3)
Sodium: 138 mmol/L (ref 135–146)
Total Protein: 6.6 g/dL (ref 6.1–8.1)

## 2017-11-02 LAB — LIPID PANEL
Cholesterol: 226 mg/dL — ABNORMAL HIGH (ref <200)
HDL: 70 mg/dL (ref >50)
LDL CHOLESTEROL (CALC): 139 mg/dL — AB
Non-HDL Cholesterol (Calc): 156 mg/dL (calc) — ABNORMAL HIGH (ref <130)
TRIGLYCERIDES: 76 mg/dL (ref <150)
Total CHOL/HDL Ratio: 3.2 (calc) (ref <5.0)

## 2017-11-07 ENCOUNTER — Encounter: Payer: Self-pay | Admitting: Family Medicine

## 2017-11-07 ENCOUNTER — Ambulatory Visit (INDEPENDENT_AMBULATORY_CARE_PROVIDER_SITE_OTHER): Payer: Medicare Other | Admitting: Family Medicine

## 2017-11-07 ENCOUNTER — Other Ambulatory Visit: Payer: Self-pay | Admitting: Family Medicine

## 2017-11-07 VITALS — BP 146/82 | HR 102 | Temp 98.2°F | Resp 16 | Ht 66.0 in | Wt 186.0 lb

## 2017-11-07 DIAGNOSIS — I10 Essential (primary) hypertension: Secondary | ICD-10-CM | POA: Diagnosis not present

## 2017-11-07 DIAGNOSIS — Z1231 Encounter for screening mammogram for malignant neoplasm of breast: Secondary | ICD-10-CM

## 2017-11-07 DIAGNOSIS — Z0000 Encounter for general adult medical examination without abnormal findings: Secondary | ICD-10-CM

## 2017-11-07 DIAGNOSIS — M8580 Other specified disorders of bone density and structure, unspecified site: Secondary | ICD-10-CM

## 2017-11-07 DIAGNOSIS — Z1239 Encounter for other screening for malignant neoplasm of breast: Secondary | ICD-10-CM

## 2017-11-07 MED ORDER — HYDROCHLOROTHIAZIDE 12.5 MG PO CAPS: 12.5000 mg | ORAL_CAPSULE | Freq: Every day | ORAL | 3 refills | Status: AC

## 2017-11-07 MED ORDER — AMLODIPINE BESY-BENAZEPRIL HCL 5-10 MG PO CAPS: 1.0000 | ORAL_CAPSULE | Freq: Every day | ORAL | 4 refills | Status: AC

## 2017-11-07 NOTE — Progress Notes (Signed)
Subjective:    Patient ID: Katherine Bass, female    DOB: March 28, 1941, 77 y.o.   MRN: [id number]  HPI  Patient is a 77 year old white female who is here today for complete physical exam. She has no concerns. She is due today for a flu shot and zostavax.  She refuses a flu shot and zostavax. . She continues to refuse a colonoscopy.  She would like me to schedule her for mammogram.  She is very hesitant for any preventative tests. She does not require a Pap smear.  Similar to last year, her blood pressure is elevated today along with her resting heart rate however she is very anxious and has a history of whitecoat syndrome Past Medical History:  Diagnosis Date  . Hyperlipidemia   . Hypertension     Current Outpatient Medications:  .  amLODipine-benazepril (LOTREL) 5-10 MG capsule, Take 1 capsule by mouth daily., Disp: 90 capsule, Rfl: 4 .  Cholecalciferol (VITAMIN D) 2000 units CAPS, Take 1 capsule by mouth., Disp: , Rfl:  .  hydrochlorothiazide (MICROZIDE) 12.5 MG capsule, Take 1 capsule (12.5 mg total) by mouth daily., Disp: 90 capsule, Rfl: 3  Allergies  Allergen Reactions  . Eggs Or Egg-Derived Products Nausea Only    HA   Social History   Socioeconomic History  . Marital status: Married    Spouse name: Not on file  . Number of children: Not on file  . Years of education: Not on file  . Highest education level: Not on file  Social Needs  . Financial resource strain: Not on file  . Food insecurity - worry: Not on file  . Food insecurity - inability: Not on file  . Transportation needs - medical: Not on file  . Transportation needs - non-medical: Not on file  Occupational History  . Not on file  Tobacco Use  . Smoking status: Never Smoker  . Smokeless tobacco: Never Used  Substance and Sexual Activity  . Alcohol use: No  . Drug use: No  . Sexual activity: Not on file  Other Topics Concern  . Not on file  Social History Narrative  . Not on file   No family history on  file.   Review of Systems  All other systems reviewed and are negative.      Objective:   Physical Exam  Constitutional: She is oriented to person, place, and time. She appears well-developed and well-nourished. No distress.  HENT:  Head: Normocephalic and atraumatic.  Right Ear: External ear normal.  Left Ear: External ear normal.  Nose: Nose normal.  Mouth/Throat: Oropharynx is clear and moist. No oropharyngeal exudate.  Eyes: Conjunctivae and EOM are normal. Pupils are equal, round, and reactive to light. Right eye exhibits no discharge. Left eye exhibits no discharge. No scleral icterus.  Neck: Normal range of motion. Neck supple. No JVD present. No tracheal deviation present. No thyromegaly present.  Cardiovascular: Normal rate, regular rhythm, normal heart sounds and intact distal pulses. Exam reveals no gallop and no friction rub.  No murmur heard. Pulmonary/Chest: Effort normal and breath sounds normal. No stridor. No respiratory distress. She has no wheezes. She has no rales. She exhibits no tenderness.  Abdominal: Soft. Bowel sounds are normal. She exhibits no distension and no mass. There is no tenderness. There is no rebound and no guarding.  Musculoskeletal: Normal range of motion. She exhibits no edema or tenderness.  Lymphadenopathy:    She has no cervical adenopathy.  Neurological: She is alert and  oriented to person, place, and time. She has normal reflexes. No cranial nerve deficit. She exhibits normal muscle tone. Coordination normal.  Skin: Skin is warm. No rash noted. She is not diaphoretic. No erythema. No pallor.  Psychiatric: She has a normal mood and affect. Her behavior is normal. Judgment and thought content normal.  Vitals reviewed.  Lab on 11/02/2017  Component Date Value Ref Range Status  . WBC 11/02/2017 5.2  3.8 - 10.8 Thousand/uL Final  . RBC 11/02/2017 4.61  3.80 - 5.10 Million/uL Final  . Hemoglobin 11/02/2017 14.4  11.7 - 15.5 g/dL Final  . HCT  40/98/119101/30/2019 42.1  35.0 - 45.0 % Final  . MCV 11/02/2017 91.3  80.0 - 100.0 fL Final  . MCH 11/02/2017 31.2  27.0 - 33.0 pg Final  . MCHC 11/02/2017 34.2  32.0 - 36.0 g/dL Final  . RDW 47/82/956201/30/2019 12.6  11.0 - 15.0 % Final  . Platelets 11/02/2017 241  140 - 400 Thousand/uL Final  . MPV 11/02/2017 10.6  7.5 - 12.5 fL Final  . Neutro Abs 11/02/2017 3,021  1,500 - 7,800 cells/uL Final  . Lymphs Abs 11/02/2017 1,576  850 - 3,900 cells/uL Final  . WBC mixed population 11/02/2017 510  200 - 950 cells/uL Final  . Eosinophils Absolute 11/02/2017 31  15 - 500 cells/uL Final  . Basophils Absolute 11/02/2017 62  0 - 200 cells/uL Final  . Neutrophils Relative % 11/02/2017 58.1  % Final  . Total Lymphocyte 11/02/2017 30.3  % Final  . Monocytes Relative 11/02/2017 9.8  % Final  . Eosinophils Relative 11/02/2017 0.6  % Final  . Basophils Relative 11/02/2017 1.2  % Final  . Glucose, Bld 11/02/2017 109* 65 - 99 mg/dL Final   Comment: .            Fasting reference interval . For someone without known diabetes, a glucose value between 100 and 125 mg/dL is consistent with prediabetes and should be confirmed with a follow-up test. .   . BUN 11/02/2017 12  7 - 25 mg/dL Final  . Creat 13/08/657801/30/2019 0.66  0.60 - 0.93 mg/dL Final   Comment: For patients >76 years of age, the reference limit for Creatinine is approximately 13% higher for people identified as African-American. .   Edwena Felty. BUN/Creatinine Ratio 11/02/2017 NOT APPLICABLE  6 - 22 (calc) Final  . Sodium 11/02/2017 138  135 - 146 mmol/L Final  . Potassium 11/02/2017 4.4  3.5 - 5.3 mmol/L Final  . Chloride 11/02/2017 104  98 - 110 mmol/L Final  . CO2 11/02/2017 28  20 - 32 mmol/L Final  . Calcium 11/02/2017 9.5  8.6 - 10.4 mg/dL Final  . Total Protein 11/02/2017 6.6  6.1 - 8.1 g/dL Final  . Albumin 46/96/295201/30/2019 3.9  3.6 - 5.1 g/dL Final  . Globulin 84/13/244001/30/2019 2.7  1.9 - 3.7 g/dL (calc) Final  . AG Ratio 11/02/2017 1.4  1.0 - 2.5 (calc) Final  . Total  Bilirubin 11/02/2017 0.6  0.2 - 1.2 mg/dL Final  . Alkaline phosphatase (APISO) 11/02/2017 67  33 - 130 U/L Final  . AST 11/02/2017 28  10 - 35 U/L Final  . ALT 11/02/2017 17  6 - 29 U/L Final  . Cholesterol 11/02/2017 226* <200 mg/dL Final  . HDL 10/27/253601/30/2019 70  >50 mg/dL Final  . Triglycerides 11/02/2017 76  <150 mg/dL Final  . LDL Cholesterol (Calc) 11/02/2017 139* mg/dL (calc) Final   Comment: Reference range: <100 . Desirable range <100  mg/dL for primary prevention;   <70 mg/dL for patients with CHD or diabetic patients  with > or = 2 CHD risk factors. Marland Kitchen LDL-C is now calculated using the Martin-Hopkins  calculation, which is a validated novel method providing  better accuracy than the Friedewald equation in the  estimation of LDL-C.  Horald Pollen et al. Lenox Ahr. 9147;829(56): 2061-2068  (http://education.[facility].com/faq/FAQ164)   . Total CHOL/HDL Ratio 11/02/2017 3.2  <2.1 (calc) Final  . Non-HDL Cholesterol (Calc) 11/02/2017 156* <130 mg/dL (calc) Final   Comment: For patients with diabetes plus 1 major ASCVD risk  factor, treating to a non-HDL-C goal of <100 mg/dL  (LDL-C of <30 mg/dL) is considered a therapeutic  option.          Assessment & Plan:  Osteopenia, unspecified location  Routine general medical examination at a health care facility  Essential hypertension  Patient refuses any vaccinations.  She will let me schedule her for a mammogram.  She refuses a colonoscopy.  Bone density is up-to-date.  Blood pressure is elevated slightly but I believe this is whitecoat syndrome.  She refuses any statin medication for her elevated LDL cholesterol.  Her LDL cholesterol is borderline elevated but I do believe it is sufficiently offset by her elevated HDL cholesterol and therefore I do not believe strongly that she requires a statin.  I continue to encourage the patient for a colonoscopy and her flu shot and pneumonia vaccines however the patient continues to decline  this.  If she changes her mind I will be glad to give her those vaccines at any time

## 2017-11-24 ENCOUNTER — Ambulatory Visit: Payer: Medicare Other

## 2017-12-12 ENCOUNTER — Other Ambulatory Visit: Payer: Self-pay | Admitting: Family Medicine

## 2018-11-22 DIAGNOSIS — Z23 Encounter for immunization: Secondary | ICD-10-CM | POA: Diagnosis not present

## 2018-11-22 DIAGNOSIS — I10 Essential (primary) hypertension: Secondary | ICD-10-CM | POA: Diagnosis not present

## 2018-11-22 DIAGNOSIS — I499 Cardiac arrhythmia, unspecified: Secondary | ICD-10-CM | POA: Diagnosis not present

## 2019-05-04 ENCOUNTER — Other Ambulatory Visit: Payer: Self-pay

## 2019-05-23 DIAGNOSIS — R7309 Other abnormal glucose: Secondary | ICD-10-CM | POA: Diagnosis not present

## 2019-05-23 DIAGNOSIS — Z136 Encounter for screening for cardiovascular disorders: Secondary | ICD-10-CM | POA: Diagnosis not present

## 2019-05-23 DIAGNOSIS — I10 Essential (primary) hypertension: Secondary | ICD-10-CM | POA: Diagnosis not present

## 2019-06-07 DIAGNOSIS — I10 Essential (primary) hypertension: Secondary | ICD-10-CM | POA: Diagnosis not present

## 2019-06-07 DIAGNOSIS — R7309 Other abnormal glucose: Secondary | ICD-10-CM | POA: Diagnosis not present

## 2019-06-07 DIAGNOSIS — Z136 Encounter for screening for cardiovascular disorders: Secondary | ICD-10-CM | POA: Diagnosis not present

## 2022-09-18 ENCOUNTER — Encounter (HOSPITAL_COMMUNITY): Payer: Self-pay

## 2022-09-18 ENCOUNTER — Other Ambulatory Visit: Payer: Self-pay

## 2022-09-18 ENCOUNTER — Emergency Department (HOSPITAL_COMMUNITY)
Admission: EM | Admit: 2022-09-18 | Discharge: 2022-09-18 | Disposition: A | Discharge status: Home / Self Care | Payer: Medicare Other | Attending: Emergency Medicine | Admitting: Emergency Medicine

## 2022-09-18 DIAGNOSIS — I10 Essential (primary) hypertension: Secondary | ICD-10-CM | POA: Insufficient documentation

## 2022-09-18 DIAGNOSIS — E785 Hyperlipidemia, unspecified: Secondary | ICD-10-CM | POA: Diagnosis not present

## 2022-09-18 DIAGNOSIS — R040 Epistaxis: Secondary | ICD-10-CM | POA: Insufficient documentation

## 2022-09-18 DIAGNOSIS — Z7901 Long term (current) use of anticoagulants: Secondary | ICD-10-CM | POA: Insufficient documentation

## 2022-09-18 NOTE — Discharge Instructions (Addendum)
You were seen in the emergency department for a nose bleed.  It's reassuring that it stopped on its own. I think you're fine to take your medicine as scheduled tonight. Make sure to let your doctor know you were seen here this evening and follow up with them as needed.  Return to the ER for any new or recurring symptoms.

## 2022-09-18 NOTE — ED Triage Notes (Signed)
States he nose has been bleeding since earlier, states she is on blood thinners, but states it has stopped

## 2022-09-19 NOTE — ED Provider Notes (Signed)
[facility]-EMERGENCY DEPT Provider Note   CSN: [id number] Arrival date & time: 09/18/22  2023     History  Chief Complaint  Patient presents with   Epistaxis    Katherine Bass is a 81 y.o. female with history of hyperlipidemia, hypertension, atrial fibrillation on Eliquis who presents the emergency department complaining of a nosebleed.  Patient states that her nose started bleeding earlier this evening, she tried to pinch it for 20-minute intervals with paper towels, but it was not stopping.  While in the waiting room patient states that she removed what she thought was a clot, which was fairly long in size.  After this happened the bleeding stopped.  No recurrence.   Epistaxis      Home Medications Prior to Admission medications   Medication Sig Start Date End Date Taking? Authorizing Provider  amLODipine-benazepril (LOTREL) 5-10 MG capsule Take 1 capsule by mouth daily. 11/07/17   Donita Brooks, MD  Cholecalciferol (VITAMIN D) 2000 units CAPS Take 1 capsule by mouth.    [provider]  hydrochlorothiazide (MICROZIDE) 12.5 MG capsule Take 1 capsule (12.5 mg total) by mouth daily. 11/07/17   Donita Brooks, MD  hydrochlorothiazide (MICROZIDE) 12.5 MG capsule TAKE 1 CAPSULE(12.5 MG) BY MOUTH DAILY 12/12/17   Donita Brooks, MD      Allergies    Eggs or egg-derived products    Review of Systems   Review of Systems  HENT:  Positive for nosebleeds.   All other systems reviewed and are negative.   Physical Exam Updated Vital Signs BP (!) 153/85   Pulse (!) 54   Temp 98.3 F (36.8 C) (Oral)   Resp 18   Ht 5\' 6"  (1.676 m)   Wt 62.6 kg   BMI 22.27 kg/m  Physical Exam Vitals and nursing note reviewed.  Constitutional:      Appearance: Normal appearance.  HENT:     Head: Normocephalic and atraumatic.     Nose:     Right Nostril: No epistaxis or occlusion.     Left Nostril: No epistaxis or occlusion.  Eyes:     Conjunctiva/sclera:  Conjunctivae normal.  Pulmonary:     Effort: Pulmonary effort is normal. No respiratory distress.  Skin:    General: Skin is warm and dry.  Neurological:     Mental Status: She is alert.  Psychiatric:        Mood and Affect: Mood normal.        Behavior: Behavior normal.     ED Results / Procedures / Treatments   Labs (all labs ordered are listed, but only abnormal results are displayed) Labs Reviewed - No data to display  EKG None  Radiology No results found.  Procedures Procedures    Medications Ordered in ED Medications - No data to display  ED Course/ Medical Decision Making/ A&P                           Medical Decision Making  Patient is a 81 year old female with history of hyperlipidemia, hypertension, atrial fibrillation on Eliquis who presents the emergency department complaining of a nosebleed.    On my exam nosebleed had resolved.  Patient states that she removed a large clot while in the waiting room. During her 1 hour observation while in the ER, has had no recurrence of symptoms.  After consideration of the patient's symptoms and resolution of epistaxis. I feel that emergency department  workup does not suggest an emergent condition requiring admission or immediate intervention beyond what has been performed at this time. The plan is: discharge to home with recommendation to follow up with PCP.  Continue eliquis as the risks of holding it are greater than deterring recurrence of epistaxis. However, strongly encouraged patient to return to ER if symptoms return. The patient is safe for discharge and has been instructed to return immediately for worsening symptoms, change in symptoms or any other concerns.  Final Clinical Impression(s) / ED Diagnoses Final diagnoses:  Epistaxis    Rx / DC Orders ED Discharge Orders     None      Portions of this report may have been transcribed using voice recognition software. Every effort was made to ensure  accuracy; however, inadvertent computerized transcription errors may be present.    Jeanella Flattery 09/19/22 1224    Vanetta Mulders, MD 09/20/22 1818
